# Patient Record
Sex: Female | Born: 1955 | Race: White | Hispanic: No
Health system: Southern US, Community
[De-identification: ages and names within clinical notes are randomized; demographics above are authoritative.]

---

## 2014-06-20 ENCOUNTER — Observation Stay: Payer: Self-pay | Admitting: Internal Medicine

## 2014-06-20 ENCOUNTER — Ambulatory Visit: Payer: Self-pay | Admitting: Neurology

## 2014-06-20 LAB — URINALYSIS, COMPLETE
Bacteria: NONE SEEN
Bilirubin,UR: NEGATIVE
GLUCOSE, UR: NEGATIVE mg/dL (ref 0–75)
KETONE: NEGATIVE
Nitrite: NEGATIVE
Ph: 7 (ref 4.5–8.0)
Protein: NEGATIVE
SPECIFIC GRAVITY: 1.01 (ref 1.003–1.030)
WBC UR: 4 /HPF (ref 0–5)

## 2014-06-20 LAB — COMPREHENSIVE METABOLIC PANEL
ALK PHOS: 69 U/L
Albumin: 3.8 g/dL (ref 3.4–5.0)
Anion Gap: 12 (ref 7–16)
BUN: 16 mg/dL (ref 7–18)
Bilirubin,Total: 0.4 mg/dL (ref 0.2–1.0)
CO2: 26 mmol/L (ref 21–32)
Calcium, Total: 8 mg/dL — ABNORMAL LOW (ref 8.5–10.1)
Chloride: 103 mmol/L (ref 98–107)
Creatinine: 1.13 mg/dL (ref 0.60–1.30)
EGFR (African American): >60
EGFR (Non-African Amer.): 52 — ABNORMAL LOW
Glucose: 149 mg/dL — ABNORMAL HIGH (ref 65–99)
Osmolality: 285 (ref 275–301)
Potassium: 3.2 mmol/L — ABNORMAL LOW (ref 3.5–5.1)
SGOT(AST): 35 U/L (ref 15–37)
SGPT (ALT): 27 U/L
Sodium: 141 mmol/L (ref 136–145)
TOTAL PROTEIN: 7.2 g/dL (ref 6.4–8.2)

## 2014-06-20 LAB — CBC WITH DIFFERENTIAL/PLATELET
BASOS PCT: 0.3 %
Basophil #: 0 10*3/uL (ref 0.0–0.1)
EOS PCT: 2.9 %
Eosinophil #: 0.3 10*3/uL (ref 0.0–0.7)
HCT: 39.7 % (ref 35.0–47.0)
HGB: 13.4 g/dL (ref 12.0–16.0)
LYMPHS ABS: 2.2 10*3/uL (ref 1.0–3.6)
LYMPHS PCT: 24.4 %
MCH: 30.1 pg (ref 26.0–34.0)
MCHC: 33.8 g/dL (ref 32.0–36.0)
MCV: 89 fL (ref 80–100)
MONO ABS: 0.7 x10 3/mm (ref 0.2–0.9)
Monocyte %: 7.7 %
Neutrophil #: 5.9 10*3/uL (ref 1.4–6.5)
Neutrophil %: 64.7 %
Platelet: 224 10*3/uL (ref 150–440)
RBC: 4.47 10*6/uL (ref 3.80–5.20)
RDW: 12.6 % (ref 11.5–14.5)
WBC: 9.2 10*3/uL (ref 3.6–11.0)

## 2014-06-20 LAB — TSH: Thyroid Stimulating Horm: 6.16 u[IU]/mL — ABNORMAL HIGH

## 2014-06-20 LAB — MAGNESIUM: Magnesium: 2 mg/dL

## 2014-12-05 NOTE — H&P (Signed)
PATIENT NAME:  Mindy Parsons, Mindy Parsons MR#:  [id number] DATE OF BIRTH:  [birth year]  DATE OF ADMISSION:  06/20/2014  REFERRING PHYSICIAN: Bayard Males, MD.    PRIMARY CARE DOCTOR:  Nonlocal.   ADMISSION DIAGNOSIS: New-onset seizure.   HISTORY OF PRESENT ILLNESS: This is a 59 year old Caucasian female was brought to the Emergency Department via EMS for new-onset seizure. The patient has never had a seizure before including during childhood. Seizure was unwitnessed, but she was found by her husband after he was awakened by her yelling or gasping as if she had a bad dream. He witnessed the end of the seizure activity, which he acknowledges was shaking, but he cannot characterize the nature of the movement of her limbs as he was in shock at the time and that it was dark. The patient had a seizure during her sleep. Her husband sat her up once he saw her convulsing after which she promptly stopped, but was very disoriented and lethargic. She was brought in by EMS and required no airway protection. On exam here in the Emergency Department the patient has no recollection of the events, of course, but only complains of having bitten her tongue. She admits to having hit her head approximately 36 hours prior to admission. She also notes that she has been very fatigued and sleep deprived lately, as her job has been more strenuous than she anticipated. Due to the new onset of seizure activity at her age the Emergency Department called for admission.   REVIEW OF SYSTEMS:   CONSTITUTIONAL: The patient denies fever, but admits to fatigue.  EYES: Denies blurry vision or inflammation.  EARS, NOSE AND THROAT: Denies tinnitus or difficulty swallowing.  RESPIRATORY: Denies cough or shortness of breath.  CARDIOVASCULAR: Denies chest pain, palpitations or dyspnea on exertion.  GASTROINTESTINAL: Denies nausea, vomiting, diarrhea or abdominal pain.  GENITOURINARY: Denies dysuria, increased frequency, or hesitancy.  ENDOCRINE:  Denies polyuria or polydipsia.  HEMATOLOGIC AND LYMPHATIC: Denies easy bleeding or bruising.  INTEGUMENT: Denies rashes or lesions.  MUSCULOSKELETAL: The patient denies myalgias or arthralgias, but admits to a bruise on her scalp.  NEUROLOGIC: Denies numbness in her extremities or dysarthria or headache.  PSYCHIATRIC: Denies depression or anxiety.   PAST MEDICAL HISTORY: Hypothyroidism, hyperlipidemia.   PAST SURGICAL HISTORY: Left elbow repair, as well as left bunion repair.   FAMILY HISTORY: None.   SOCIAL HISTORY: The patient lives with her husband. She does not smoke, drink or do any drugs.   MEDICATIONS: Levothyroxine, dose is unknown at this time one, 1 tablet p.o. daily.   ALLERGIES: EGGS AND BEE STINGS.   PERTINENT LABORATORY RESULTS AND RADIOGRAPHIC FINDINGS: Serum glucose 149. BUN 16, creatinine 1.13, sodium is 141, potassium is 3.2, chloride is 103, bicarbonate 26, calcium 8, alkaline phosphatase 69, AST is 35, ALT 27. White blood cell count 9.2, hemoglobin 13.4, hematocrit is 39.7, platelet count 224,000.  Urinalysis is negative for infection.   CAT scan of the head without contrast shows no acute intracranial abnormalities. There is some mild cerebral atrophy.   PHYSICAL EXAMINATION:  VITAL SIGNS: Temperature is 97.4, pulse 98, respirations 18, blood pressure 166/84, pulse oximetry 96% on room air.  GENERAL: The patient is tired but oriented x 3, in no apparent distress.  HEENT: The patient does not have any deformities to her scalp but does have a tiny hematoma on the parietooccipital aspect of the left side of her scalp. There is no compressibility to the skull bones. Pupils are equal, round, and reactive  to light and accommodation. Extraocular movements are intact. Funduscopic exam is normal. Mucous membranes are moist. There is a tiny contusion and laceration to the tongue.  NECK: Trachea is midline. No adenopathy.  CHEST: Symmetric and atraumatic.  CARDIOVASCULAR:  Regular rate and rhythm. Normal S1, S2. No rubs, clicks, or murmurs appreciated.  LUNGS: Clear to auscultation bilaterally.  ABDOMEN: Positive bowel sounds. Soft, nontender, nondistended. No hepatosplenomegaly.  GENITOURINARY: Deferred.  MUSCULOSKELETAL: The patient moves all 4 extremities equally. She has 5 out of 5 strength in upper and lower extremities bilaterally.  SKIN: No rashes or lesions.  EXTREMITIES: No clubbing, cyanosis, or edema.  NEUROLOGIC: Cranial nerves II-XII are grossly intact. There is a negative Babinski reflex. I cannot elicit any reflexes from the patellar tendons.  PSYCHIATRIC: Mood is normal. Affect is congruent.   ASSESSMENT AND PLAN: This is a 59 year old female admitted for new-onset seizure.  1.  Seizure, unwitnessed. It is difficult to characterize the nature of her seizure, as her husband cannot describe her movements. She has had some recent head trauma but her head CT does not show any bleed. We will admit the patient for observation and obtain an MRI tomorrow. I have ordered a neurology consult, as well. Notably, the patient's sodium is also normal.  2.  Hypothyroidism. We will continue her Synthroid when we verify the dose. I have ordered a TSH to determine if she had been on the appropriate dose of hormone replacement.  3.  Hyperlipidemia. Continue statin.  4.  Obesity. BMI is 31.3. I recommend diet and exercise.  5.  DVT prophylaxis. No pharmacologic prophylaxis at this time as the patient could potentially have a tiny cerebral bleed or subdural hemorrhage. She has sequential compression devices ordered for now.  6. GI prophylaxis is unnecessary, as the patient is not critically ill.   CODE STATUS: The patient is a full code.   Time spent on admission orders and patient care: Approximately 30 minutes.    ____________________________ Kelton PillarMichael S. Sheryle Hailiamond, MD msd:lt D: 06/20/2014 06:37:20 ET T: 06/20/2014 07:31:34 ET JOB#: [id number]  cc: Kelton PillarMichael S.  Sheryle Hailiamond, MD, <Dictator> Kelton PillarMICHAEL S Natally Ribera MD ELECTRONICALLY SIGNED 06/22/2014 0:16

## 2014-12-05 NOTE — Discharge Summary (Signed)
PATIENT NAME:  Mindy Parsons, Mindy Parsons MR#:  [id number] DATE OF BIRTH:  Dec 17, 1955  DATE OF ADMISSION:  06/20/2014 DATE OF DISCHARGE:  06/20/2014   PRIMARY CARE PHYSICIAN: Non local.  DISCHARGE DIAGNOSES: New-onset seizure.   CONDITION: Stable.   CODE STATUS: Full Code.   MEDICATIONS: Please refer to the medication reconciliation list.   DIET: Regular diet.   ACTIVITY: As tolerated.   FOLLOWUP CARE: Follow with PCP within 1-2 weeks.   HOSPITAL COURSE: The patient is a 59 year old Caucasian female with a history of hypothyroidism, who was sent to the ED after possible seizure activity. For a detailed history and physical examination, please refer to the admission note dictated by Dr. Sheryle Hailiamond.  After admission, the patient was placed on seizure precautions. MRI of the brain was done which did not show any CVA or abnormality. Dr. Pauletta BrownsYuriy Zeylikman, neurologist, evaluated the patient and suggested that the patient's new onset seizure is likely due to sleep deprivation and electrolytes. The patient had no seizure after admission. She is stable and she can be discharged to home without seizure medications according to Dr. Pauletta BrownsYuriy Zeylikman. The patient has no complaints. Vital signs are stable. Physical examination is unremarkable. She is clinically stable. We will be discharging her home today.   I discussed the patient's discharge plan with the patient's nurse and Dr. Pauletta BrownsYuriy Zeylikman.  TIME SPENT: About 42 minutes.    ____________________________ Shaune PollackQing Samanth Mirkin, MD qc:MT D: 06/20/2014 14:35:00 ET T: 06/20/2014 14:53:21 ET JOB#: [id number]  cc: Shaune PollackQing Kam Kushnir, MD, <Dictator> Shaune PollackQING Avery Eustice MD ELECTRONICALLY SIGNED 06/20/2014 15:34

## 2014-12-05 NOTE — Consult Note (Signed)
PATIENT NAME:  Mindy Parsons, Mindy Parsons MR#:  [id number] DATE OF BIRTH:  [birth year]  DATE OF CONSULTATION:  06/20/2014  REFERRING PHYSICIAN:   CONSULTING PHYSICIAN:  Pauletta BrownsYuriy Shyana Kulakowski, MD  REASON FOR CONSULTATION: Seizure activity.   HISTORY OF PRESENT ILLNESS: The patient is a 59 year old female who presented with questionable new onset of seizure. Information obtained from the patient and the patient's husband, who is currently at bedside. The patient went to sleep around 1:00 a.m. The patient and the patient's husband sleep in different bedrooms, the reason being is that the patient husband snores. The patient's husband woke up by a dog barking. When he came in to his wife's room, he noticed that she was confused and disoriented. He did not notice any shaking activity. The patient did bite the right lateral aspect of her tongue. She has some increased stiffness and post confusion. From history, the patient has been sleep deprived recently. A new job, where she works in [employer], has been very stressful and she states there are not enough hours in the day to complete her tasks.   No new medications.   Electrolytes reviewed are within normal limits.   REVIEW OF SYSTEMS: RESPIRATORY: Denies any shortness of breath.  HEENT: Denies any blurry vision.  CONSTITUTIONAL: Positive for generalized fatigue.  GASTROINTESTINAL: Denies abdominal pain.  ENDOCRINOLOGIC: Denies any heat or cold intolerance.  PSYCHIATRIC: Denies any depression or anxiety.  NEUROLOGICAL: No previous history of depression or seizure activity.   PAST SURGICAL HISTORY: Left elbow repair.   FAMILY HISTORY: None.   SOCIAL HISTORY: The patient lives with her husband. No smoking, drinking, or EtOH use.   ALLERGIES INCLUDE: EGGS AND BEE STINGS.   LABORATORY DATA: Workup has been reviewed.   IMAGING: CAT Scan of the Head: No acute intracranial abnormality. MRI: Has been reviewed. No acute intracranial abnormality noted. Chronic T2  hyperintensities noted that are nonspecific; I do not think contribute to the current status.   PHYSICAL EXAMINATION:  VITAL SIGNS INCLUDE: A temperature of 98.3, pulse 98, respirations 18, blood pressure 145/85, pulse oximetry 92.   NEUROLOGICAL EVALUATION: The patient is alert, awake and oriented to time, place, location and  reason she is in the hospital. Facial sensation intact. Facial motor is intact. Tongue is midline. Speech is fluent. No facial droop noted. Motor Strength: 5/5 bilateral upper and lower extremities. Sensation: Intact to light touch and temperature. Coordination: Finger-to-nose intact. Gait: Could not be assessed.   IMPRESSION: A 59 year old female who presented with questionable a seizure episode, likely due to sleep deprivation; electrolytes within normal limits.   PLAN: Significant time spent talking to patient about the risks of sleep deprivation; that specifically causing possibly seizure activity with lower threshold. Imaging reviewed as above. Will not start any antiepileptic medication at this time. Discussed the need for assistance if the patient is swimming or taking a bath. Follow up with neurology as an outpatient as needed. It is safe to be discharged home from a neurological standpoint.   The case discussed with family and nursing staff.   Thank you. It was a pleasure seeing this patient. Please call with any questions.    ____________________________ Pauletta BrownsYuriy Jesscia Imm, MD yz:MT D: 06/20/2014 13:18:12 ET T: 06/20/2014 13:46:22 ET JOB#: [id number]  cc: Pauletta BrownsYuriy Kavonte Bearse, MD, <Dictator> Pauletta BrownsYURIY Aleyna Cueva MD ELECTRONICALLY SIGNED 07/13/2014 13:38

## 2016-04-26 IMAGING — MR MRI HEAD WITHOUT AND WITH CONTRAST
9 of 12 series · 30 of 48 positions shown · IV contrast (multihance)
Comparison: CT head without contrast 06/20/2014.

CLINICAL DATA: Seizure-like episode during patient's sleep in which
she awoke, yelled out, and then became unresponsive.

EXAM:
MRI HEAD WITHOUT AND WITH CONTRAST
TECHNIQUE: Multiplanar, multiecho pulse sequences of the brain and surrounding
structures were obtained without and with intravenous contrast.
CONTRAST:  17 mL MultiHance

[Series 4: DWI · axial · 5.0mm · 1.80mm/px · z∈[-93,+74]mm · 3 of 27 slices shown (1 of 2)]
[im 1/27]
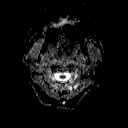
[im 14/27]
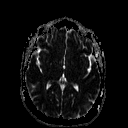
[im 27/27]
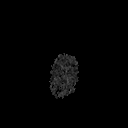

[Series 5: T2 · axial · 5.0mm · 0.60mm/px · z∈[-92,+75]mm · 3 of 27 slices shown (1 of 4)]
[im 1/27]
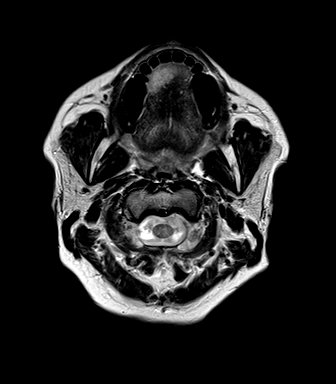
[im 14/27]
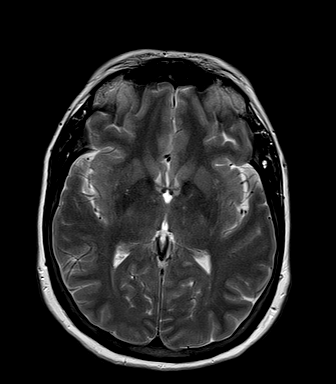
[im 27/27]
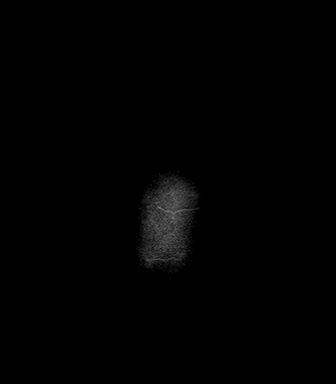

[Series 6: FLAIR · axial · 5.0mm · 0.45mm/px · z∈[-93,+74]mm · 3 of 27 slices shown (1 of 2)]
[im 1/27]
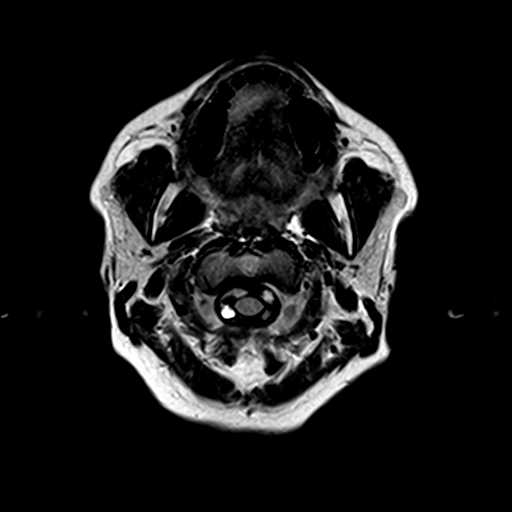
[im 14/27]
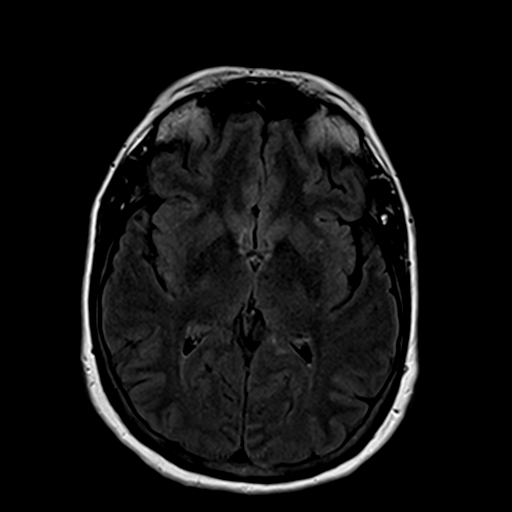
[im 27/27]
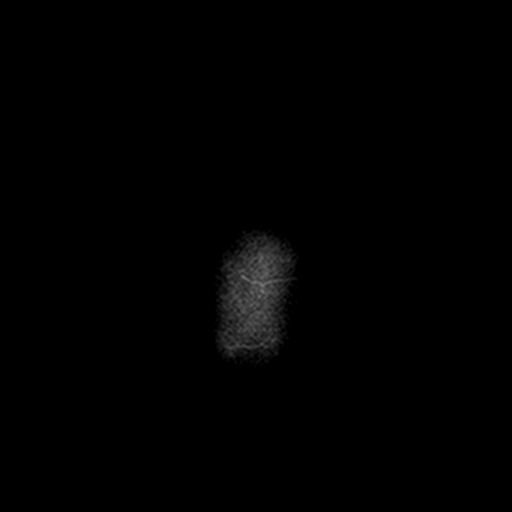

[Series 7: T2 · axial · 5.0mm · 0.45mm/px · z∈[-92,+75]mm · 3 of 27 slices shown (2 of 4)]
[im 1/27]
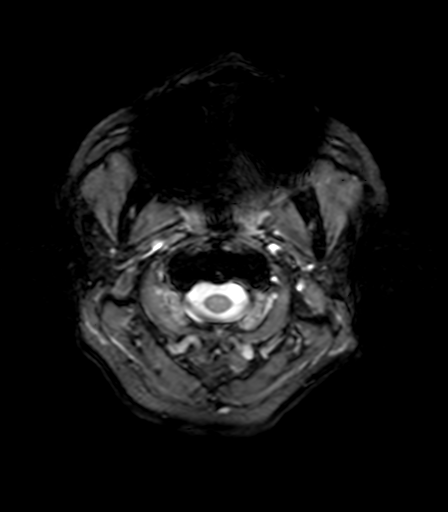
[im 14/27]
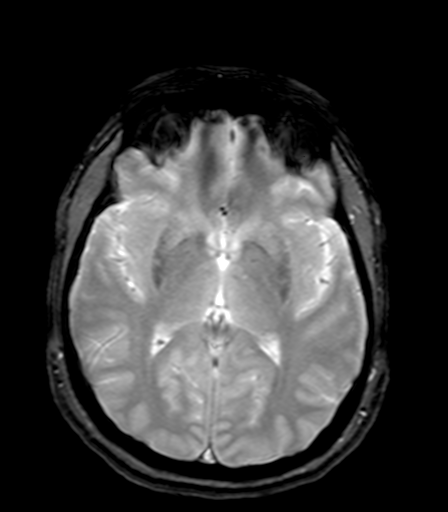
[im 27/27]
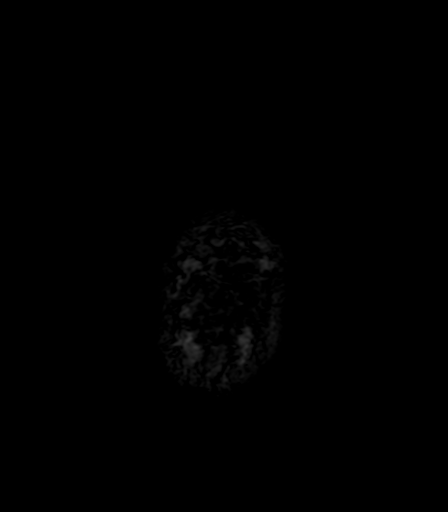

[Series 9: T2 · coronal · 3.0mm · 0.47mm/px · 4 of 35 slices shown (3 of 4)]
[im 1/35]
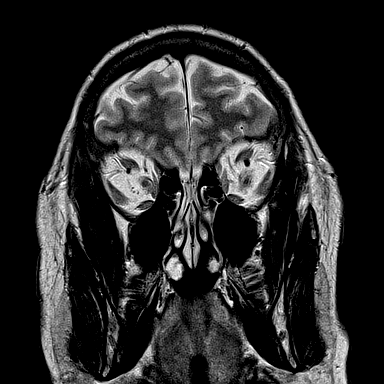
[im 12/35]
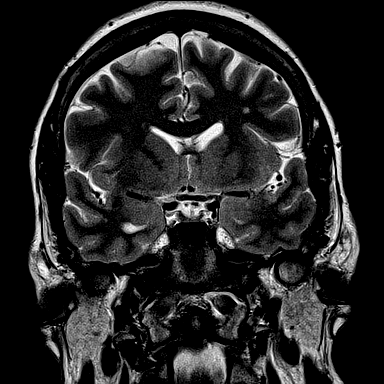
[im 23/35]
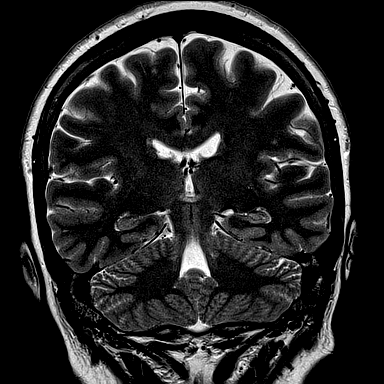
[im 35/35]
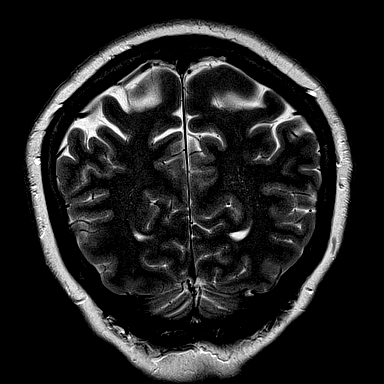

[Series 10: FLAIR · sagittal · 5.0mm · 0.43mm/px · 3 of 27 slices shown (2 of 2)]
[im 1/27]
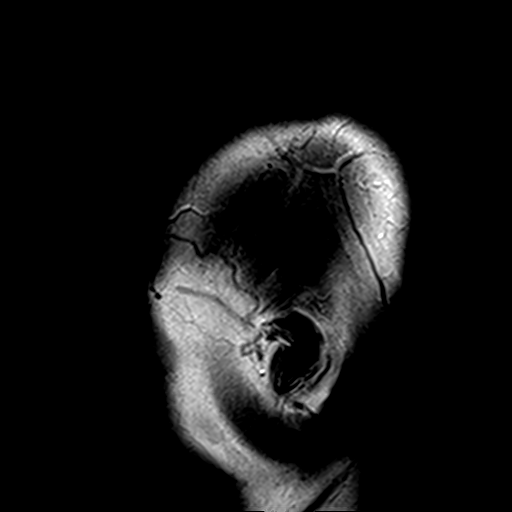
[im 14/27]
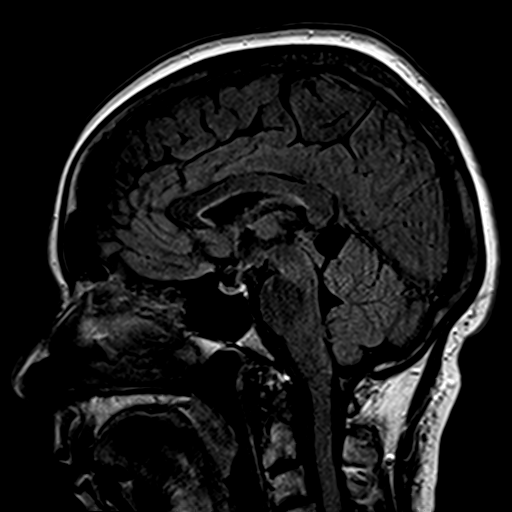
[im 27/27]
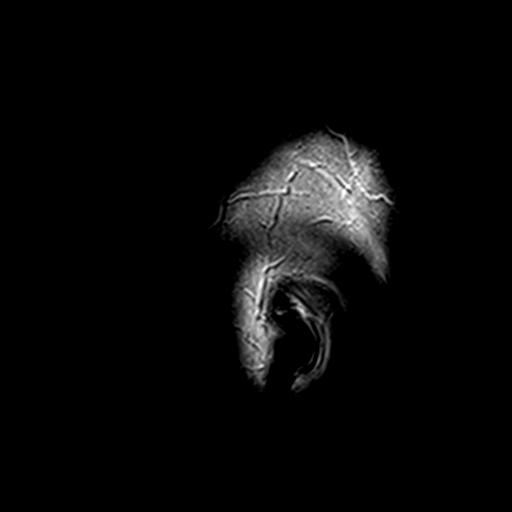

[Series 11: T2 · coronal · 5.0mm · 0.49mm/px · 4 of 30 slices shown (4 of 4)]
[im 1/30]
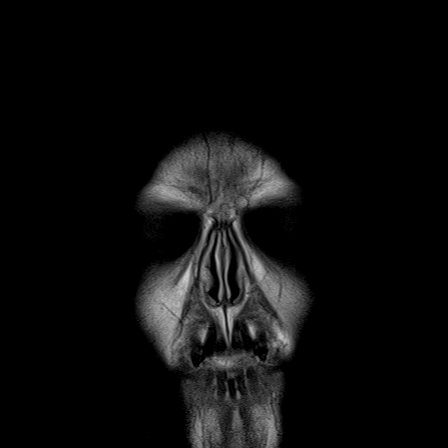
[im 10/30]
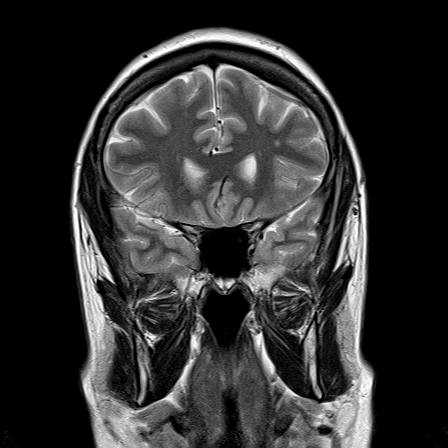
[im 20/30]
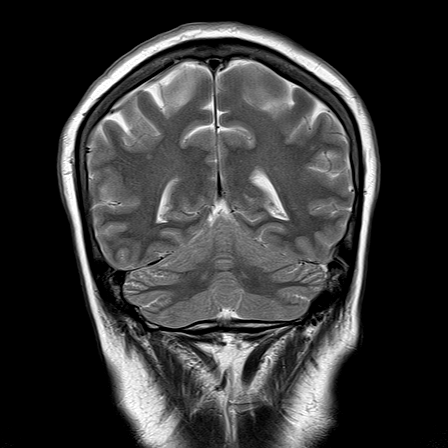
[im 30/30]
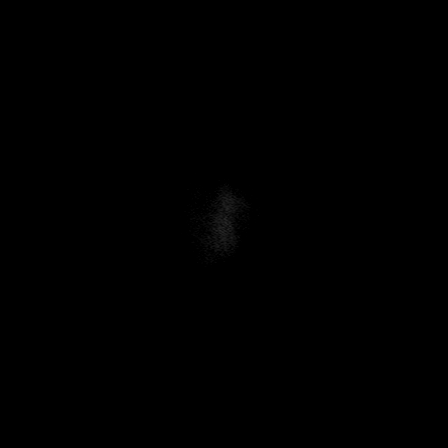

[Series 13: T1 post-contrast · coronal · 5.0mm · 0.43mm/px · 4 of 30 slices shown]
[im 1/30]
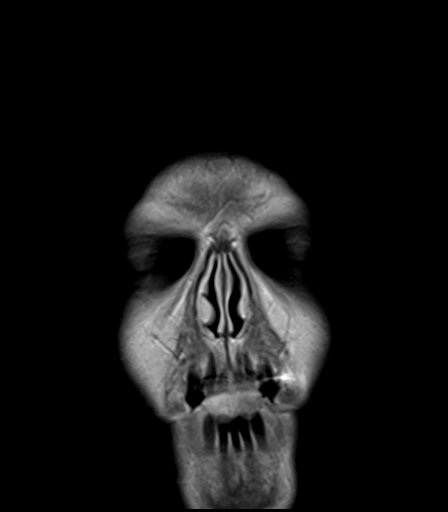
[im 10/30]
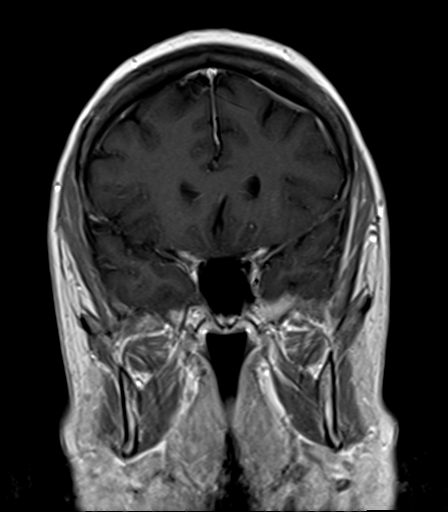
[im 20/30]
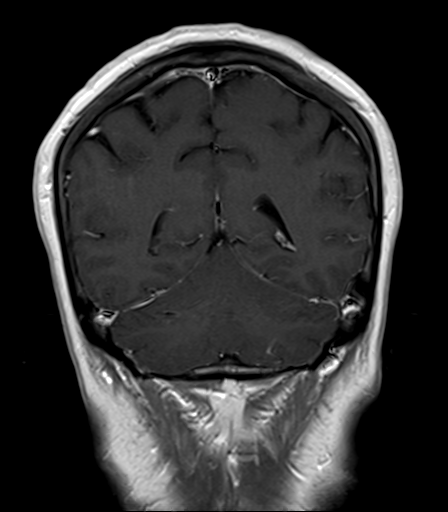
[im 30/30]
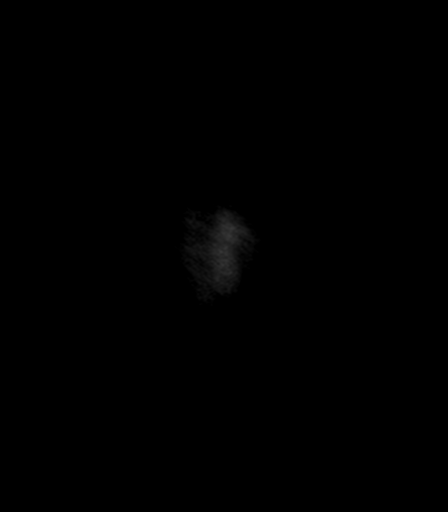

[Series 100: DWI · axial · 5.0mm · 1.80mm/px · z∈[-93,+74]mm · 3 of 27 slices shown (2 of 2)]
[im 1/27]
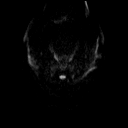
[im 14/27]
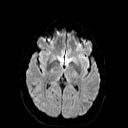
[im 27/27]
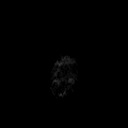

[30 of 48 positions shown; findings below may reference images not displayed]

FINDINGS: Scattered subcortical T2 hyperintensities are present bilaterally.
No acute cortical infarct, hemorrhage, or mass lesion is present.
The ventricles are of normal size. No significant extraaxial fluid
collection is present.

Flow is present in the major intracranial arteries. The globes and
orbits are intact. The paranasal sinuses and mastoid air cells are
clear.

Dedicated imaging of the temporal lobes demonstrate symmetric size
and signal within the hippocampal structures.

The postcontrast images demonstrate no pathologic enhancement.
IMPRESSION: 1. No acute intracranial abnormality.
2. Scattered subcortical T2 hyperintensities bilaterally are
advanced for age. The finding is nonspecific but can be seen in the
setting of chronic microvascular ischemia, a demyelinating process
such as multiple sclerosis, vasculitis, complicated migraine
headaches, or as the sequelae of a prior infectious or inflammatory
process.
3. No focal lesion or temporal lobe abnormality to explain seizures.
